# Patient Record
Sex: Female | Born: 1995 | Hispanic: Yes | Marital: Single | Smoking: Never smoker
Health system: Southern US, Community
[De-identification: ages and names within clinical notes are randomized; demographics above are authoritative.]

---
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
## 2017-03-23 ENCOUNTER — Other Ambulatory Visit: Payer: Self-pay | Admitting: Cardiology

## 2017-03-23 DIAGNOSIS — R55 Syncope and collapse: Secondary | ICD-10-CM

## 2017-03-31 ENCOUNTER — Ambulatory Visit: Payer: Self-pay

## 2021-06-26 ENCOUNTER — Ambulatory Visit: Payer: Medicaid Other

## 2021-07-01 ENCOUNTER — Ambulatory Visit: Payer: Medicaid Other

## 2021-07-01 ENCOUNTER — Encounter: Payer: Self-pay | Admitting: Physician Assistant

## 2021-07-01 ENCOUNTER — Ambulatory Visit (LOCAL_COMMUNITY_HEALTH_CENTER): Payer: Medicaid Other | Admitting: Physician Assistant

## 2021-07-01 ENCOUNTER — Other Ambulatory Visit: Payer: Self-pay

## 2021-07-01 VITALS — BP 113/69 | Ht 61.0 in | Wt 219.6 lb

## 2021-07-01 DIAGNOSIS — F419 Anxiety disorder, unspecified: Secondary | ICD-10-CM

## 2021-07-01 DIAGNOSIS — Z0000 Encounter for general adult medical examination without abnormal findings: Secondary | ICD-10-CM

## 2021-07-01 DIAGNOSIS — Z3009 Encounter for other general counseling and advice on contraception: Secondary | ICD-10-CM

## 2021-07-01 DIAGNOSIS — Z30011 Encounter for initial prescription of contraceptive pills: Secondary | ICD-10-CM | POA: Diagnosis not present

## 2021-07-01 MED ORDER — NORGESTIMATE-ETH ESTRADIOL 0.25-35 MG-MCG PO TABS: 1.0000 | ORAL_TABLET | Freq: Every day | ORAL | 12 refills | Status: AC

## 2021-07-02 ENCOUNTER — Encounter: Payer: Self-pay | Admitting: Physician Assistant

## 2021-07-02 DIAGNOSIS — F419 Anxiety disorder, unspecified: Secondary | ICD-10-CM | POA: Insufficient documentation

## 2021-07-02 NOTE — Progress Notes (Signed)
[facility] DEPARTMENT [facility] [address] Main Number: [phone]    Family Planning Visit- Initial Visit  Subjective:  Gabrielle Schroeder is a 25 y.o.  [id number]   being seen today for an initial annual visit and to discuss contraceptive options.  The patient is currently using Abstinence for pregnancy prevention. Patient reports she does not want a pregnancy in the next year.  Patient has the following medical conditions does not have a problem list on file.  Chief Complaint  Patient presents with   Contraception    Initial annual exam    Patient reports that she is here for a physical and to discuss BCM.  Patient states that she has anxiety and depression and is seeing a therapist but is not on any medications at this time.  Patient is concerned about her weight and wants to lose some weight but has not been taking any action to lose.  Headaches are "random" and relieved with rest or OTC analgesics.  States that she was having some vaginal symptoms but used an OTC treatment which resolved the symptoms. Patient asks if OCP will help decrease her urge to masturbate.  Per chart review, CBE and pap are due today.  Patient denies that she has no other concerns today.   Body mass index is 41.49 kg/m. - Patient is eligible for diabetes screening based on BMI and age >60?  not applicable HA1C ordered? not applicable  Patient reports 1  partner/s in last year. Desires STI screening?  No - patient declines  Has patient been screened once for HCV in the past?  No  No results found for: HCVAB  Does the patient have current drug use (including MJ), have a partner with drug use, and/or has been incarcerated since last result? No  If yes-- Screen for HCV through [facility]   Does the patient meet criteria for HBV testing? No  Criteria:  -Household, sexual or needle sharing contact with HBV -History of drug use -HIV positive -Those with  known Hep C   Health Maintenance Due  Topic Date Due   COVID-19 Vaccine (1) Never done   HPV VACCINES (1 - 2-dose series) Never done   HIV Screening  Never done   Hepatitis C Screening  Never done   TETANUS/TDAP  Never done   PAP-Cervical Cytology Screening  Never done   PAP SMEAR-Modifier  Never done   INFLUENZA VACCINE  Never done    Review of Systems  All other systems reviewed and are negative.  The following portions of the patient's history were reviewed and updated as appropriate: allergies, current medications, past family history, past medical history, past social history, past surgical history and problem list. Problem list updated.   See flowsheet for other program required questions.  Objective:   Vitals:   07/01/21 1107  BP: 113/69  Weight: 219 lb 9.6 oz (99.6 kg)  Height: 5\' 1"  (1.549 m)    Physical Exam Vitals and nursing note reviewed.  Constitutional:      General: She is not in acute distress.    Appearance: Normal appearance.  HENT:     Head: Normocephalic and atraumatic.     Mouth/Throat:     Mouth: Mucous membranes are moist.     Pharynx: Oropharynx is clear. No oropharyngeal exudate or posterior oropharyngeal erythema.  Eyes:     Conjunctiva/sclera: Conjunctivae normal.  Cardiovascular:     Rate and Rhythm: Normal rate and regular  rhythm.  Pulmonary:     Effort: Pulmonary effort is normal.     Breath sounds: Normal breath sounds.  Abdominal:     Palpations: Abdomen is soft. There is no mass.     Tenderness: There is no abdominal tenderness. There is no guarding or rebound.  Musculoskeletal:     Cervical back: Neck supple. No tenderness.  Lymphadenopathy:     Cervical: No cervical adenopathy.  Skin:    General: Skin is warm and dry.     Findings: No bruising, erythema, lesion or rash.  Neurological:     Mental Status: She is alert and oriented to person, place, and time.  Psychiatric:        Mood and Affect: Mood normal.         Behavior: Behavior normal.        Thought Content: Thought content normal.        Judgment: Judgment normal.      Assessment and Plan:  [name] is a 25 y.o. female presenting to the [facility] Department for an initial annual wellness/contraceptive visit  Contraception counseling: Reviewed all forms of birth control options in the tiered based approach. available including abstinence; over the counter/barrier methods; hormonal contraceptive medication including pill, patch, ring, injection,contraceptive implant, ECP; hormonal and nonhormonal IUDs; permanent sterilization options including vasectomy and the various tubal sterilization modalities. Risks, benefits, and typical effectiveness rates were reviewed.  Questions were answered.  Written information was also given to the patient to review.  Patient desires to start OCP, this was prescribed for patient. She will follow up in  1 year and prn for surveillance.  She was told to call with any further questions, or with any concerns about this method of contraception.  Emphasized use of condoms 100% of the time for STI prevention.  Patient was not a candidate for ECP today.  1. Encounter for counseling regarding contraception Reviewed with patient as above re: BCM options. Reviewed with patient normal SE of OCP and when to call clinic with concerns. Enc condoms with all sex for STD protection.   2. Well woman exam (no gynecological exam) Reviewed with patient healthy habits to maintain general health. Counseled patient regarding low fat, high fiber diet, regular exercise and eating regular meals, drinking plenty of water to help toward weight loss goals. Enc patient to keep headache diary to possible identify triggers and reduce number of headaches. Enc MVI 1 po daily. Enc to establish with/ follow up with PCP for primary care concerns, age appropriate screenings and illness.   3. OCP (oral contraceptive pills)  initiation Rx for Sprintec 28 d 1 po daily at the same time each day sent to patient's pharmacy of choice. Rec condoms with all sex for 2 weeks of first cycle and especially if late/missed OCP. Counseled patient that OCP usually makes periods regular, shorter and lighter with less cramping but that they are not generally prescribed to decrease urge to masturbate.  Enc patient to discuss with therapist. - norgestimate-ethinyl estradiol (SPRINTEC 28) 0.25-35 MG-MCG tablet; Take 1 tablet by mouth daily.  Dispense: 28 tablet; Refill: 12  4. Anxiety Patient with PHQ-9=18 and with diagnosis of anxiety and depression. Patient denies suicidal or homicidal thoughts today and states that she is currently in therapy. Please give patient [employer] card and LCSW card to use if needed in future.     No follow-ups on file.  No future appointments.  Matt Holmes, PA

## 2021-08-26 ENCOUNTER — Other Ambulatory Visit: Payer: Self-pay

## 2021-08-26 ENCOUNTER — Ambulatory Visit: Payer: Self-pay | Admitting: Family Medicine

## 2021-08-26 DIAGNOSIS — B86 Scabies: Secondary | ICD-10-CM

## 2021-08-26 DIAGNOSIS — L239 Allergic contact dermatitis, unspecified cause: Secondary | ICD-10-CM

## 2021-08-26 MED ORDER — PERMETHRIN 5 % EX CREA: 1.0000 "application " | TOPICAL_CREAM | Freq: Once | CUTANEOUS | 0 refills | Status: DC

## 2021-08-26 NOTE — Progress Notes (Signed)
[facility] Department  STI clinic/screening visit [address] [place] [address] [phone]  Subjective:  Ilah Boule is a 25 y.o. female being seen today for an STI screening visit. The patient reports they do have symptoms.  Patient reports that they do not desire a pregnancy in the next year.   They reported they are not interested in discussing contraception today.    No LMP recorded.   Patient has the following medical conditions:   Patient Active Problem List   Diagnosis Date Noted   Anxiety 07/02/2021    Chief Complaint  Patient presents with   Follow-up   SEXUALLY TRANSMITTED DISEASE    Brother has scabies, pt is itching all over.      HPI  Patient reports here for rash   No HIV test per records  No previous pap   Screening for MPX risk: Does the patient have an unexplained rash? Yes Is the patient MSM? No Does the patient endorse multiple sex partners or anonymous sex partners? No Did the patient have close or sexual contact with a person diagnosed with MPX? No Has the patient traveled outside the [place] where MPX is endemic? No Is there a high clinical suspicion for MPX-- evidenced by one of the following No  -Unlikely to be chickenpox  -Lymphadenopathy  -Rash that present in same phase of evolution on any given body part See flowsheet for further details and programmatic requirements.    The following portions of the patient's history were reviewed and updated as appropriate: allergies, current medications, past medical history, past social history, past surgical history and problem list.  Objective:  There were no vitals filed for this visit.  Physical Exam Constitutional:      Appearance: Normal appearance. She is obese.  HENT:     Head: Normocephalic and atraumatic.  Chest:  Breasts:    Right: Normal.     Left: Normal.       Comments: Pt has liner irritation across bilateral breast where bra rest.   Genitourinary:    Comments: Deferred  Musculoskeletal:     Cervical back: Normal range of motion and neck supple.  Skin:    General: Skin is warm and dry.     Findings: Erythema and rash present.     Comments: No other irritations noted.  No burrows between fingers or any other places on skin.  Erythema on bilateral breast where bra rest.   Neurological:     General: No focal deficit present.     Mental Status: She is alert and oriented to person, place, and time.     Assessment and Plan:  Harshita Bernales is a 25 y.o. female presenting to the [facility] Department for STI screening  1. Allergic dermatitis Pt reported initially, brother was treated for scabies, but at this visit , pt denies brother being treated for scabies.   Pt reports generalized itching over body.  Pt has itchy macular rash on bilateral breast.    When questioned pt does report changing laundry detergent.   Discussed trying OTC antihistamine, hydrocortisone 1% cream, using mild laundry detergent, information sheet given about dermatitis, and to follow up with pcp if symptoms continue or worsen    No follow-ups on file.  No future appointments.  Wendi Snipes, FNP
